# Patient Record
Sex: Male | Born: 1960 | Race: Black or African American | Hispanic: No | Smoking: Current every day smoker
Health system: Southern US, Community
[De-identification: ages and names within clinical notes are randomized; demographics above are authoritative.]

## PROBLEM LIST (undated history)

## (undated) DIAGNOSIS — I2510 Atherosclerotic heart disease of native coronary artery without angina pectoris: Secondary | ICD-10-CM

## (undated) DIAGNOSIS — I10 Essential (primary) hypertension: Secondary | ICD-10-CM

---

## 2020-09-05 DIAGNOSIS — I214 Non-ST elevation (NSTEMI) myocardial infarction: Secondary | ICD-10-CM | POA: Insufficient documentation

## 2021-03-30 ENCOUNTER — Observation Stay (HOSPITAL_BASED_OUTPATIENT_CLINIC_OR_DEPARTMENT_OTHER)
Admission: EM | Admit: 2021-03-30 | Discharge: 2021-03-31 | Disposition: A | Discharge status: Home / Self Care | Payer: Medicaid Other | Attending: Internal Medicine | Admitting: Internal Medicine

## 2021-03-30 ENCOUNTER — Encounter (HOSPITAL_BASED_OUTPATIENT_CLINIC_OR_DEPARTMENT_OTHER): Payer: Self-pay | Admitting: Emergency Medicine

## 2021-03-30 ENCOUNTER — Other Ambulatory Visit: Payer: Self-pay

## 2021-03-30 ENCOUNTER — Emergency Department (HOSPITAL_BASED_OUTPATIENT_CLINIC_OR_DEPARTMENT_OTHER): Payer: Medicaid Other

## 2021-03-30 ENCOUNTER — Observation Stay (HOSPITAL_BASED_OUTPATIENT_CLINIC_OR_DEPARTMENT_OTHER): Payer: Medicaid Other

## 2021-03-30 DIAGNOSIS — Z20822 Contact with and (suspected) exposure to covid-19: Secondary | ICD-10-CM | POA: Insufficient documentation

## 2021-03-30 DIAGNOSIS — I2510 Atherosclerotic heart disease of native coronary artery without angina pectoris: Secondary | ICD-10-CM | POA: Diagnosis not present

## 2021-03-30 DIAGNOSIS — E785 Hyperlipidemia, unspecified: Secondary | ICD-10-CM | POA: Diagnosis not present

## 2021-03-30 DIAGNOSIS — F17200 Nicotine dependence, unspecified, uncomplicated: Secondary | ICD-10-CM

## 2021-03-30 DIAGNOSIS — R0602 Shortness of breath: Secondary | ICD-10-CM | POA: Diagnosis present

## 2021-03-30 DIAGNOSIS — F17210 Nicotine dependence, cigarettes, uncomplicated: Secondary | ICD-10-CM | POA: Diagnosis not present

## 2021-03-30 DIAGNOSIS — Z955 Presence of coronary angioplasty implant and graft: Secondary | ICD-10-CM | POA: Diagnosis not present

## 2021-03-30 DIAGNOSIS — I10 Essential (primary) hypertension: Secondary | ICD-10-CM | POA: Diagnosis not present

## 2021-03-30 DIAGNOSIS — I4891 Unspecified atrial fibrillation: Secondary | ICD-10-CM

## 2021-03-30 DIAGNOSIS — Z7982 Long term (current) use of aspirin: Secondary | ICD-10-CM | POA: Diagnosis not present

## 2021-03-30 DIAGNOSIS — Z79899 Other long term (current) drug therapy: Secondary | ICD-10-CM | POA: Diagnosis not present

## 2021-03-30 HISTORY — DX: Essential (primary) hypertension: I10

## 2021-03-30 HISTORY — DX: Atherosclerotic heart disease of native coronary artery without angina pectoris: I25.10

## 2021-03-30 LAB — ECHOCARDIOGRAM COMPLETE
Area-P 1/2: 3.39 cm2
Height: 69 in
S' Lateral: 3.1 cm
Weight: 2927.71 oz

## 2021-03-30 LAB — RESP PANEL BY RT-PCR (FLU A&B, COVID) ARPGX2
Influenza A by PCR: NEGATIVE
Influenza B by PCR: NEGATIVE
SARS Coronavirus 2 by RT PCR: NEGATIVE

## 2021-03-30 LAB — MRSA NEXT GEN BY PCR, NASAL: MRSA by PCR Next Gen: NOT DETECTED

## 2021-03-30 LAB — CBC
HCT: 43.2 % (ref 39.0–52.0)
Hemoglobin: 14.2 g/dL (ref 13.0–17.0)
MCH: 30.3 pg (ref 26.0–34.0)
MCHC: 32.9 g/dL (ref 30.0–36.0)
MCV: 92.3 fL (ref 80.0–100.0)
Platelets: 391 10*3/uL (ref 150–400)
RBC: 4.68 MIL/uL (ref 4.22–5.81)
RDW: 13.7 % (ref 11.5–15.5)
WBC: 5.9 10*3/uL (ref 4.0–10.5)
nRBC: 0 % (ref 0.0–0.2)

## 2021-03-30 LAB — TSH: TSH: 1.468 u[IU]/mL (ref 0.350–4.500)

## 2021-03-30 LAB — BASIC METABOLIC PANEL
Anion gap: 9 (ref 5–15)
BUN: 13 mg/dL (ref 6–20)
CO2: 22 mmol/L (ref 22–32)
Calcium: 9.6 mg/dL (ref 8.9–10.3)
Chloride: 107 mmol/L (ref 98–111)
Creatinine, Ser: 0.99 mg/dL (ref 0.61–1.24)
GFR, Estimated: >60 mL/min (ref >60)
Glucose, Bld: 117 mg/dL — ABNORMAL HIGH (ref 70–99)
Potassium: 4.1 mmol/L (ref 3.5–5.1)
Sodium: 138 mmol/L (ref 135–145)

## 2021-03-30 LAB — HIV ANTIBODY (ROUTINE TESTING W REFLEX): HIV Screen 4th Generation wRfx: NONREACTIVE

## 2021-03-30 LAB — D-DIMER, QUANTITATIVE: D-Dimer, Quant: <0.27 ug/mL-FEU (ref 0.00–0.50)

## 2021-03-30 LAB — T4, FREE: Free T4: 0.84 ng/dL (ref 0.61–1.12)

## 2021-03-30 LAB — TROPONIN I (HIGH SENSITIVITY)
Troponin I (High Sensitivity): 22 ng/L — ABNORMAL HIGH (ref <18)
Troponin I (High Sensitivity): 14 ng/L (ref <18)

## 2021-03-30 LAB — HEPARIN LEVEL (UNFRACTIONATED): Heparin Unfractionated: 0.52 IU/mL (ref 0.30–0.70)

## 2021-03-30 MED ORDER — ASPIRIN 325 MG PO TABS
325.0000 mg | ORAL_TABLET | Freq: Every day | ORAL | Status: DC
  Administered 2021-03-30: 325 mg via ORAL
  Filled 2021-03-30: qty 1

## 2021-03-30 MED ORDER — ACETAMINOPHEN 325 MG PO TABS: 650.0000 mg | ORAL_TABLET | ORAL | Status: DC | PRN

## 2021-03-30 MED ORDER — THIAMINE HCL 100 MG PO TABS
100.0000 mg | ORAL_TABLET | Freq: Every day | ORAL | Status: DC
  Administered 2021-03-31: 10:00:00 100 mg via ORAL
  Filled 2021-03-30: qty 1

## 2021-03-30 MED ORDER — LOSARTAN POTASSIUM 25 MG PO TABS
25.0000 mg | ORAL_TABLET | Freq: Every day | ORAL | Status: DC
  Administered 2021-03-30 – 2021-03-31 (×2): 25 mg via ORAL
  Filled 2021-03-30 (×2): qty 1

## 2021-03-30 MED ORDER — FOLIC ACID 1 MG PO TABS
1.0000 mg | ORAL_TABLET | Freq: Every day | ORAL | Status: DC
  Administered 2021-03-31: 10:00:00 1 mg via ORAL
  Filled 2021-03-30: qty 1

## 2021-03-30 MED ORDER — SODIUM CHLORIDE 0.9% FLUSH: 3.0000 mL | INTRAVENOUS | Status: DC | PRN

## 2021-03-30 MED ORDER — THIAMINE HCL 100 MG/ML IJ SOLN: 100.0000 mg | Freq: Every day | INTRAMUSCULAR | Status: DC

## 2021-03-30 MED ORDER — ADULT MULTIVITAMIN W/MINERALS CH
1.0000 | ORAL_TABLET | Freq: Every day | ORAL | Status: DC
  Administered 2021-03-31: 10:00:00 1 via ORAL
  Filled 2021-03-30: qty 1

## 2021-03-30 MED ORDER — AMIODARONE HCL 200 MG PO TABS
200.0000 mg | ORAL_TABLET | Freq: Every day | ORAL | Status: DC
  Administered 2021-03-30 – 2021-03-31 (×2): 200 mg via ORAL
  Filled 2021-03-30 (×2): qty 1

## 2021-03-30 MED ORDER — ATORVASTATIN CALCIUM 80 MG PO TABS
80.0000 mg | ORAL_TABLET | Freq: Every day | ORAL | Status: DC
  Administered 2021-03-30: 80 mg via ORAL
  Filled 2021-03-30: qty 1

## 2021-03-30 MED ORDER — ASPIRIN EC 81 MG PO TBEC
81.0000 mg | DELAYED_RELEASE_TABLET | Freq: Every day | ORAL | Status: DC
  Administered 2021-03-31: 10:00:00 81 mg via ORAL
  Filled 2021-03-30: qty 1

## 2021-03-30 MED ORDER — HEPARIN (PORCINE) 25000 UT/250ML-% IV SOLN
1200.0000 [IU]/h | INTRAVENOUS | Status: DC
Start: 2021-03-30 — End: 2021-03-31
  Administered 2021-03-30 – 2021-03-31 (×2): 1200 [IU]/h via INTRAVENOUS
  Filled 2021-03-30 (×2): qty 250

## 2021-03-30 MED ORDER — SODIUM CHLORIDE 0.9% FLUSH
3.0000 mL | Freq: Two times a day (BID) | INTRAVENOUS | Status: DC
  Administered 2021-03-30: 3 mL via INTRAVENOUS

## 2021-03-30 MED ORDER — NICOTINE 21 MG/24HR TD PT24
21.0000 mg | MEDICATED_PATCH | Freq: Every day | TRANSDERMAL | Status: DC
  Administered 2021-03-30 – 2021-03-31 (×2): 21 mg via TRANSDERMAL
  Filled 2021-03-30 (×2): qty 1

## 2021-03-30 MED ORDER — LORAZEPAM 1 MG PO TABS
1.0000 mg | ORAL_TABLET | ORAL | Status: DC | PRN
  Administered 2021-03-30: 2 mg via ORAL
  Administered 2021-03-31: 10:00:00 1 mg via ORAL
  Filled 2021-03-30 (×3): qty 1

## 2021-03-30 MED ORDER — DILTIAZEM HCL-DEXTROSE 125-5 MG/125ML-% IV SOLN (PREMIX)
5.0000 mg/h | INTRAVENOUS | Status: DC
  Administered 2021-03-30: 5 mg/h via INTRAVENOUS
  Filled 2021-03-30 (×2): qty 125

## 2021-03-30 MED ORDER — PRASUGREL HCL 10 MG PO TABS
10.0000 mg | ORAL_TABLET | Freq: Every day | ORAL | Status: DC
  Administered 2021-03-31: 10:00:00 10 mg via ORAL
  Filled 2021-03-30: qty 1

## 2021-03-30 MED ORDER — ZOLPIDEM TARTRATE 5 MG PO TABS: 5.0000 mg | ORAL_TABLET | Freq: Every evening | ORAL | Status: DC | PRN

## 2021-03-30 MED ORDER — AMIODARONE HCL 200 MG PO TABS: 200.0000 mg | ORAL_TABLET | Freq: Every day | ORAL | Status: DC

## 2021-03-30 MED ORDER — DILTIAZEM HCL 25 MG/5ML IV SOLN
20.0000 mg | Freq: Once | INTRAVENOUS | Status: AC
  Administered 2021-03-30: 20 mg via INTRAVENOUS
  Filled 2021-03-30: qty 5

## 2021-03-30 MED ORDER — METOPROLOL SUCCINATE ER 100 MG PO TB24
100.0000 mg | ORAL_TABLET | Freq: Every day | ORAL | Status: DC
  Administered 2021-03-30 – 2021-03-31 (×2): 100 mg via ORAL
  Filled 2021-03-30 (×2): qty 1

## 2021-03-30 MED ORDER — HEPARIN BOLUS VIA INFUSION
4000.0000 [IU] | Freq: Once | INTRAVENOUS | Status: AC
  Administered 2021-03-30: 4000 [IU] via INTRAVENOUS

## 2021-03-30 MED ORDER — HYDRALAZINE HCL 20 MG/ML IJ SOLN: 10.0000 mg | Freq: Four times a day (QID) | INTRAMUSCULAR | Status: DC | PRN

## 2021-03-30 MED ORDER — SODIUM CHLORIDE 0.9 % IV SOLN: 250.0000 mL | INTRAVENOUS | Status: DC | PRN

## 2021-03-30 MED ORDER — MELATONIN 3 MG PO TABS
3.0000 mg | ORAL_TABLET | Freq: Every day | ORAL | Status: DC
Start: 2021-03-30 — End: 2021-03-31
  Administered 2021-03-30: 3 mg via ORAL
  Filled 2021-03-30: qty 1

## 2021-03-30 MED ORDER — ONDANSETRON HCL 4 MG/2ML IJ SOLN: 4.0000 mg | Freq: Four times a day (QID) | INTRAMUSCULAR | Status: DC | PRN

## 2021-03-30 MED ORDER — LORAZEPAM 2 MG/ML IJ SOLN: 1.0000 mg | INTRAMUSCULAR | Status: DC | PRN

## 2021-03-30 NOTE — ED Notes (Signed)
Pt given blanket, and belongings placed in bag per family , pt signed transfer/ transport request

## 2021-03-30 NOTE — Progress Notes (Signed)
  Echocardiogram 2D Echocardiogram has been performed.  Roosvelt Maser F 03/30/2021, 5:42 PM

## 2021-03-30 NOTE — ED Triage Notes (Signed)
Cough x 3 days , shortness of breath last night . Denies chest pain .

## 2021-03-30 NOTE — Progress Notes (Signed)
ANTICOAGULATION CONSULT NOTE   Pharmacy Consult for Heparin Indication: atrial fibrillation  No Known Allergies  Patient Measurements: Height: 5\' 9"  (175.3 cm) Weight: 83 kg (182 lb 15.7 oz) IBW/kg (Calculated) : 70.7 Heparin Dosing Weight: 81.6 kg   Vital Signs: Temp: 98.8 F (37.1 C) (11/16 1652) Temp Source: Oral (11/16 1652) BP: 148/113 (11/16 1652) Pulse Rate: 84 (11/16 1652)  Labs: Recent Labs    03/30/21 1022 03/30/21 1450 03/30/21 1744  HGB 14.2  --   --   HCT 43.2  --   --   PLT 391  --   --   HEPARINUNFRC  --   --  0.52  CREATININE 0.99  --   --   TROPONINIHS 22* 14  --     Estimated Creatinine Clearance: 80.3 mL/min (by C-G formula based on SCr of 0.99 mg/dL).   Medical History: Past Medical History:  Diagnosis Date   Coronary artery disease    Hypertension      Assessment: 60 yo male presented on 03/30/2021 with SOB and cough. Pharmacy consulted to dose heparin for Afib. Patient is not on anticoagulation prior to admission.  Heparin level therapeutic this PM   Goal of Therapy:  Heparin level 0.3-0.7 units/ml Monitor platelets by anticoagulation protocol: Yes   Plan:  Continue heparin at 1200 units / hr Follow daily heparin level, CBC  Planning transition to Eliquis  Thank you 04/01/2021, PharmD 03/30/2021 6:52 PM

## 2021-03-30 NOTE — Progress Notes (Signed)
ANTICOAGULATION CONSULT NOTE - Initial Consult  Pharmacy Consult for Heparin Indication: atrial fibrillation  No Known Allergies  Patient Measurements: Height: 5\' 9"  (175.3 cm) Weight: 81.6 kg (180 lb) IBW/kg (Calculated) : 70.7 Heparin Dosing Weight: 81.6 kg   Vital Signs: Temp: 97.9 F (36.6 C) (11/16 1010) Temp Source: Oral (11/16 1010) BP: 150/107 (11/16 1010) Pulse Rate: 28 (11/16 1010)  Labs: No results for input(s): HGB, HCT, PLT, APTT, LABPROT, INR, HEPARINUNFRC, HEPRLOWMOCWT, CREATININE, CKTOTAL, CKMB, TROPONINIHS in the last 72 hours.  CrCl cannot be calculated (No successful lab value found.).   Medical History: Past Medical History:  Diagnosis Date   Coronary artery disease    Hypertension      Assessment: 60 yo male presented on 03/30/2021 with SOB and cough. Pharmacy consulted to dose heparin for Afib. Patient is not on anticoagulation prior to admission. CBC IP.   Goal of Therapy:  Heparin level 0.3-0.7 units/ml Monitor platelets by anticoagulation protocol: Yes   Plan:  Heparin 4000 unit bolus  Start heparin 1200 units/hr Check 6 hr heparin level   Monitor heparin level, CBC and s/s of bleeding daily  Follow up transition to oral anticoagulation  04/01/2021, PharmD, BCPS Clinical Pharmacist 03/30/2021 10:33 AM

## 2021-03-30 NOTE — Plan of Care (Signed)
  Problem: Elimination: Goal: Will not experience complications related to urinary retention Outcome: Progressing   Problem: Safety: Goal: Ability to remain free from injury will improve Outcome: Progressing   Problem: Education: Goal: Understanding of medication regimen will improve Outcome: Not Progressing   Problem: Health Behavior/Discharge Planning: Goal: Ability to safely manage health-related needs after discharge will improve Outcome: Not Progressing

## 2021-03-30 NOTE — Progress Notes (Signed)
HOSPITAL MEDICINE OVERNIGHT EVENT NOTE    Nursing reports the patient has been exhibiting increasing agitation throughout the evening.  Nursing states that patient reports drinking at least 6 alcoholic drinks daily.  Initiating CIWA protocol with as needed Ativan.  Monitoring for evidence of withdrawal and monitoring for response to as needed benzodiazepines.  Marinda Elk  MD [facility]

## 2021-03-30 NOTE — H&P (Addendum)
[facility] History and Physical  Zachary Savage [id number] DOB: 28-Apr-1961 DOA: 03/30/2021  Referring physician: ED  PCP: Pcp, No   Patient is coming from: Home  Chief Complaint: Shortness of breath  HPI: Zachary Savage is a 60 y.o. male with past medical history of coronary artery disease, status post stent, hypertension, active smoking, hyperlipidemia currently on aspirin prasugrel presented to the hospital with shortness of breath for 2 to 3 days prior to presentation with mild cough.  Patient denied any chest pain fever or chills but had some chest tightness.Marland Kitchen  He complained of nonproductive cough.  He did have some stuffy nose as well.  Patient stated that he did have heart attack 7 to 8 months back and was seen in [place] where he had stent placement.  After that he was on aspirin and Effient statins and beta-blockers but has missed a few doses of statin and beta-blockers.  Patient continues to smoke despite the fact that he is trying to quit.  Patient denies any dizziness, lightheadedness, syncope.  Patient denies any fever, chills or rigor.  Denies any nausea, vomiting or abdominal pain.  Patient denies any urinary urgency, frequency or dysuria.  Denies any history of atrial fibrillation or thyroid disease in the past.  ED Course: In the ED, patient was noted to be in atrial fibrillation with RVR and was started on heparin drip and IV Cardizem.  Patient was then considered for admission to the hospital.  Review of Systems:  All systems were reviewed and were negative unless otherwise mentioned in the HPI  Past Medical History:  Diagnosis Date   Coronary artery disease    Hypertension   Hyperlipidemia  History reviewed. No pertinent surgical history.  Social History:  reports that he has been smoking cigarettes. He has a 10.50 pack-year smoking history. He has never used smokeless tobacco. He reports current alcohol use of about 6.0 standard drinks per week. He reports  current drug use. Frequency: 1.00 time per week. Drugs: Marijuana and Codeine.  No Known Allergies  Family History  Problem Relation Age of Onset   Stroke Mother    Heart attack Mother      Prior to Admission medications   Medication Sig Start Date End Date Taking? Authorizing Provider  aspirin 81 MG EC tablet Take 1 tablet by mouth daily. 09/07/20  Yes [provider]  atorvastatin (LIPITOR) 80 MG tablet Take 80 mg by mouth daily. 11/22/20   [provider]  losartan (COZAAR) 25 MG tablet Take 25 mg by mouth daily. 11/22/20   [provider]  metoprolol succinate (TOPROL-XL) 100 MG 24 hr tablet Take 100 mg by mouth daily. 11/22/20   [provider]  prasugrel (EFFIENT) 10 MG TABS tablet Take 10 mg by mouth daily. 02/21/21   [provider]    Physical Exam: Vitals:   03/30/21 1300 03/30/21 1400 03/30/21 1445 03/30/21 1652  BP: (!) 149/113 (!) 150/101 (!) 141/106 (!) 148/113  Pulse: 89 85 91 84  Resp: (!) 22 (!) 21 19 (!) 25  Temp:    98.8 F (37.1 C)  TempSrc:    Oral  SpO2: 100% 98% 98% 97%  Weight:    83 kg  Height:    5\' 9"  (1.753 m)   Wt Readings from Last 3 Encounters:  03/30/21 83 kg   Body mass index is 27.02 kg/m.  General:  Average built, not in obvious distress HENT: Normocephalic, pupils equally reacting to light and accommodation.  No  scleral pallor or icterus noted. Oral mucosa is moist.  Chest:  Clear breath sounds.  Diminished breath sounds bilaterally. No crackles or wheezes.  CVS: S1 &S2 heard. No murmur.  Irregular rhythm. Abdomen: Soft, nontender, nondistended.  Bowel sounds are heard.  Liver is not palpable, no abdominal mass palpated Extremities: No cyanosis, clubbing or edema.  Peripheral pulses are palpable. Psych: Alert, awake and oriented, normal mood CNS:  No cranial nerve deficits.  Power equal in all extremities.    Skin: Warm and dry.  No rashes noted.  Labs on Admission:   CBC: Recent Labs  Lab  03/30/21 1022  WBC 5.9  HGB 14.2  HCT 43.2  MCV 92.3  PLT 391    Basic Metabolic Panel: Recent Labs  Lab 03/30/21 1022  NA 138  K 4.1  CL 107  CO2 22  GLUCOSE 117*  BUN 13  CREATININE 0.99  CALCIUM 9.6    Liver Function Tests: No results for input(s): AST, ALT, ALKPHOS, BILITOT, PROT, ALBUMIN in the last 168 hours. No results for input(s): LIPASE, AMYLASE in the last 168 hours. No results for input(s): AMMONIA in the last 168 hours.  Cardiac Enzymes: No results for input(s): CKTOTAL, CKMB, CKMBINDEX, TROPONINI in the last 168 hours.  BNP (last 3 results) No results for input(s): BNP in the last 8760 hours.  ProBNP (last 3 results) No results for input(s): PROBNP in the last 8760 hours.  CBG: No results for input(s): GLUCAP in the last 168 hours.  Lipase  No results found for: LIPASE   Urinalysis No results found for: COLORURINE, APPEARANCEUR, LABSPEC, PHURINE, GLUCOSEU, HGBUR, BILIRUBINUR, KETONESUR, PROTEINUR, UROBILINOGEN, NITRITE, LEUKOCYTESUR   Drugs of Abuse  No results found for: LABOPIA, COCAINSCRNUR, LABBENZ, AMPHETMU, THCU, LABBARB    Radiological Exams on Admission: DG Chest Port 1 View  Result Date: 03/30/2021 CLINICAL DATA:  Cough and shortness of breath. EXAM: PORTABLE CHEST 1 VIEW COMPARISON:  None. FINDINGS: The heart size and mediastinal contours are within normal limits. Both lungs are clear. The visualized skeletal structures are unremarkable. IMPRESSION: No active disease. Electronically Signed   By: Obie Dredge M.D.   On: 03/30/2021 10:43    EKG: Personally reviewed by me which shows atrial fibrillation with RVR.  Assessment/Plan Principal Problem:   Atrial fibrillation with RVR (HCC) Active Problems:   CAD (coronary artery disease)  Symptomatic new onset atrial fibrillation with RVR on presentation On Cardizem drip and heparin drip.  Heart rate has improved at this time.  On metoprolol as outpatient.  Has not been taking for  last 2 to 3 days.  Will resume.  Continue to monitor overnight.  TSH and free T4 within normal limits.  He is already on aspirin and Effient as outpatient.  Might need anticoagulation due to CHA2DS2-VASc score of 2.  Will consult cardiology in the situation that he is already on dual antiplatelets with new onset atrial fibrillation..  D-dimer was negative.  COVID was negative.  Initial troponin was mildly elevated followed by normal range.  Spoke with Dr. Sharyn Lull cardiology who recommended amiodarone 200 mg p.o. daily and wean off Cardizem as tolerated.  He suggested maybe we could stop Effient and transition to aspirin and Eliquis on discharge.  Chronic artery disease status post stent placement in the outside hospital in [place].  Patient is currently on aspirin and Effient and statin as outpatient.  We will continue with that.  Check lipid panel in AM.  Hyperlipidemia.  On statins.  We will  continue.  Smoking.  Patient was extensively counseled about it.  We will put the patient on nicotine patch while in the hospital.  DVT Prophylaxis: Heparin drip  Consultant: Cardiology.  Code Status: Full code  Microbiology none  Antibiotics: None  Family Communication:  Patients' condition and plan of care including tests being ordered have been discussed with the patient  who indicate understanding and agree with the plan.   Status is: Observation  The patient remains OBS appropriate and will d/c before 2 midnights.   Severity of Illness: The appropriate patient status for this patient is OBSERVATION. Observation status is judged to be reasonable and necessary in order to provide the required intensity of service to ensure the patient's safety. The patient's presenting symptoms, physical exam findings, and initial radiographic and laboratory data in the context of their medical condition is felt to place them at decreased risk for further clinical deterioration. Furthermore, it is anticipated  that the patient will be medically stable for discharge from the hospital within 2 midnights of admission.   Signed, Flora Lipps, MD [facility] 03/30/2021

## 2021-03-30 NOTE — ED Notes (Signed)
Report to floor [id number] nurse , pt out the door with carelink

## 2021-03-30 NOTE — ED Provider Notes (Signed)
[facility] EMERGENCY DEPARTMENT Provider Note   CSN: [id number] Arrival date & time: 03/30/21  5784     History Chief Complaint  Patient presents with   Shortness of Breath    cough    Zachary Savage is a 60 y.o. male.   Shortness of Breath  60 year old male with a history of coronary artery disease status post stent placement on aspirin and prasugrel presenting to the emergency department with roughly 2 to 3 days of shortness of breath. Patient states that he has had a cough for the past 3 days and had worsening shortness of breath over that time.  He denies any chest pain.  He denies any fever or chills.  His cough is nonproductive.  He denies any history of atrial fibrillation.  He denies any lower extremity swelling.  On arrival, the patient was found to be in atrial fibrillation with RVR with rates in the 150s.  Level 5 caveat due to patient acuity.  Past Medical History:  Diagnosis Date   Coronary artery disease    Hypertension     Patient Active Problem List   Diagnosis Date Noted   Atrial fibrillation with RVR (HCC) 03/30/2021     History reviewed. No pertinent surgical history.     History reviewed. No pertinent family history.     Home Medications Prior to Admission medications   Medication Sig Start Date End Date Taking? Authorizing Provider  aspirin 81 MG EC tablet Take 1 tablet by mouth daily. 09/07/20  Yes [provider]  atorvastatin (LIPITOR) 80 MG tablet Take 80 mg by mouth daily. 11/22/20   [provider]  losartan (COZAAR) 25 MG tablet Take 25 mg by mouth daily. 11/22/20   [provider]  metoprolol succinate (TOPROL-XL) 100 MG 24 hr tablet Take 100 mg by mouth daily. 11/22/20   [provider]  prasugrel (EFFIENT) 10 MG TABS tablet Take 10 mg by mouth daily. 02/21/21   [provider]    Allergies    Patient has no known allergies.  Review of Systems   Review of Systems  Unable to  perform ROS: Acuity of condition  Respiratory:  Positive for shortness of breath.    Physical Exam Updated Vital Signs BP (!) 147/101   Pulse 84   Temp 97.9 F (36.6 C) (Oral)   Resp (!) 26   Ht 5\' 9"  (1.753 m)   Wt 81.6 kg   SpO2 96%   BMI 26.58 kg/m   Physical Exam Vitals and nursing note reviewed.  Constitutional:      General: He is not in acute distress.    Appearance: He is well-developed.  HENT:     Head: Normocephalic and atraumatic.  Eyes:     Conjunctiva/sclera: Conjunctivae normal.     Pupils: Pupils are equal, round, and reactive to light.  Neck:     Vascular: No JVD.  Cardiovascular:     Rate and Rhythm: Tachycardia present. Rhythm irregular.     Pulses: Normal pulses.     Heart sounds: No murmur heard. Pulmonary:     Effort: Pulmonary effort is normal. No respiratory distress.     Breath sounds: Normal breath sounds.  Abdominal:     General: There is no distension.     Palpations: Abdomen is soft.     Tenderness: There is no abdominal tenderness. There is no guarding.  Musculoskeletal:        General: No deformity or signs of injury.  Cervical back: Normal range of motion and neck supple.     Right lower leg: No tenderness. No edema.     Left lower leg: No tenderness. No edema.  Skin:    General: Skin is warm and dry.     Findings: No lesion or rash.  Neurological:     General: No focal deficit present.     Mental Status: He is alert. Mental status is at baseline.    ED Results / Procedures / Treatments   Labs (all labs ordered are listed, but only abnormal results are displayed) Labs Reviewed  BASIC METABOLIC PANEL - Abnormal; Notable for the following components:      Result Value   Glucose, Bld 117 (*)    All other components within normal limits  TROPONIN I (HIGH SENSITIVITY) - Abnormal; Notable for the following components:   Troponin I (High Sensitivity) 22 (*)    All other components within normal limits  RESP PANEL BY RT-PCR (FLU  A&B, COVID) [id number]  CBC  D-DIMER, QUANTITATIVE  HEPARIN LEVEL (UNFRACTIONATED)  TSH  T4, FREE  TROPONIN I (HIGH SENSITIVITY)    EKG EKG Interpretation  Date/Time:  Wednesday March 30 2021 10:10:38 EST Ventricular Rate:  161 PR Interval:    QRS Duration: 91 QT Interval:  285 QTC Calculation: 467 R Axis:   -47 Text Interpretation: Atrial fibrillation Abnormal R-wave progression, late transition LVH with secondary repolarization abnormality Inferior infarct, old Confirmed by Ernie Avena ([id number]) on 03/30/2021 10:26:09 AM  Radiology DG Chest Port 1 View  Result Date: 03/30/2021 CLINICAL DATA:  Cough and shortness of breath. EXAM: PORTABLE CHEST 1 VIEW COMPARISON:  None. FINDINGS: The heart size and mediastinal contours are within normal limits. Both lungs are clear. The visualized skeletal structures are unremarkable. IMPRESSION: No active disease. Electronically Signed   By: Obie Dredge M.D.   On: 03/30/2021 10:43    Procedures .Critical Care E&M Performed by: Ernie Avena, MD  Critical care provider statement:    Critical care time (minutes):  62   Critical care was necessary to treat or prevent imminent or life-threatening deterioration of the following conditions:  Circulatory failure   Critical care was time spent personally by me on the following activities:  Development of treatment plan with patient or surrogate, evaluation of patient's response to treatment, examination of patient, obtaining history from patient or surrogate, ordering and performing treatments and interventions, ordering and review of laboratory studies, ordering and review of radiographic studies, pulse oximetry and re-evaluation of patient's condition   Care discussed with: admitting provider   After initial E/M assessment, critical care services were subsequently performed that were exclusive of separately billable procedures or treatment.     Medications Ordered in ED Medications  diltiazem  (CARDIZEM) 125 mg in dextrose 5% 125 mL (1 mg/mL) infusion (12.5 mg/hr Intravenous Rate/Dose Change 03/30/21 1200)  heparin ADULT infusion 100 units/mL (25000 units/2109mL) (1,200 Units/hr Intravenous New Bag/Given 03/30/21 1052)  diltiazem (CARDIZEM) injection 20 mg (20 mg Intravenous Given 03/30/21 1025)  heparin bolus via infusion 4,000 Units (4,000 Units Intravenous Bolus from Bag 03/30/21 1053)    ED Course  I have reviewed the triage vital signs and the nursing notes.  Pertinent labs & imaging results that were available during my care of the patient were reviewed by me and considered in my medical decision making (see chart for details).    MDM Rules/Calculators/A&P  60 year old male with a history of coronary artery disease status post stent placement on aspirin and prasugrel presenting to the emergency department with roughly 2 to 3 days of shortness of breath. Patient states that he has had a cough for the past 3 days and had worsening shortness of breath over that time.  He denies any chest pain.  He denies any fever or chills.  His cough is nonproductive.  He denies any history of atrial fibrillation.  He denies any lower extremity swelling.  On arrival, the patient was found to be in atrial fibrillation with RVR with rates in the 150s.   EKG confirmed atrial fibrillation with RVR with a ventricular rate of 161.  The patient was administered a 20 mg IV Cardizem bolus with subsequent improvement in rate control to the low 100s.  He was started on a heparin gtt. and diltiazem gtt.  Chest x-ray revealed no acute cardiac or pulmonary abnormality.  Initial troponin was mildly elevated to 24.  A D-dimer was negative.  Low suspicion for acute PE.  The patient complains of no chest pain.  Low suspicion for ACS.  COVID-19 and influenza PCR testing was collected and negative.  CBC revealed no leukocytosis, anemia or platelet abnormality.  The patient most recently had an  ischemic cardiac catheterization in outside hospital in the [facility] area with reported stent placement due to CAD. Given the patient's new onset atrial fibrillation with RVR, plan to admit to medicine for further evaluation.  Will likely need transition to oral rate control medication and oral DOAC.   Final Clinical Impression(s) / ED Diagnoses Final diagnoses:  Atrial fibrillation with RVR [facility])    Rx / DC Orders ED Discharge Orders     None        Ernie Avena, MD 03/30/21 1220

## 2021-03-31 ENCOUNTER — Other Ambulatory Visit (HOSPITAL_COMMUNITY): Payer: Self-pay

## 2021-03-31 DIAGNOSIS — I4891 Unspecified atrial fibrillation: Secondary | ICD-10-CM | POA: Diagnosis not present

## 2021-03-31 DIAGNOSIS — F17200 Nicotine dependence, unspecified, uncomplicated: Secondary | ICD-10-CM | POA: Diagnosis not present

## 2021-03-31 DIAGNOSIS — F1010 Alcohol abuse, uncomplicated: Secondary | ICD-10-CM

## 2021-03-31 DIAGNOSIS — I2510 Atherosclerotic heart disease of native coronary artery without angina pectoris: Secondary | ICD-10-CM | POA: Diagnosis not present

## 2021-03-31 LAB — LIPID PANEL
Cholesterol: 232 mg/dL — ABNORMAL HIGH (ref 0–200)
HDL: 50 mg/dL (ref >40)
LDL Cholesterol: 166 mg/dL — ABNORMAL HIGH (ref 0–99)
Total CHOL/HDL Ratio: 4.6 RATIO
Triglycerides: 81 mg/dL (ref <150)
VLDL: 16 mg/dL (ref 0–40)

## 2021-03-31 LAB — CBC
HCT: 39.4 % (ref 39.0–52.0)
Hemoglobin: 12.6 g/dL — ABNORMAL LOW (ref 13.0–17.0)
MCH: 29.6 pg (ref 26.0–34.0)
MCHC: 32 g/dL (ref 30.0–36.0)
MCV: 92.7 fL (ref 80.0–100.0)
Platelets: 350 10*3/uL (ref 150–400)
RBC: 4.25 MIL/uL (ref 4.22–5.81)
RDW: 13.7 % (ref 11.5–15.5)
WBC: 7 10*3/uL (ref 4.0–10.5)
nRBC: 0 % (ref 0.0–0.2)

## 2021-03-31 LAB — BASIC METABOLIC PANEL
Anion gap: 9 (ref 5–15)
BUN: 10 mg/dL (ref 6–20)
CO2: 22 mmol/L (ref 22–32)
Calcium: 9.3 mg/dL (ref 8.9–10.3)
Chloride: 105 mmol/L (ref 98–111)
Creatinine, Ser: 1.08 mg/dL (ref 0.61–1.24)
GFR, Estimated: >60 mL/min (ref >60)
Glucose, Bld: 101 mg/dL — ABNORMAL HIGH (ref 70–99)
Potassium: 3.7 mmol/L (ref 3.5–5.1)
Sodium: 136 mmol/L (ref 135–145)

## 2021-03-31 LAB — HEPARIN LEVEL (UNFRACTIONATED): Heparin Unfractionated: 0.62 IU/mL (ref 0.30–0.70)

## 2021-03-31 MED ORDER — THIAMINE HCL 100 MG PO TABS: 100.0000 mg | ORAL_TABLET | Freq: Every day | ORAL | Status: AC

## 2021-03-31 MED ORDER — APIXABAN 5 MG PO TABS: 5.0000 mg | ORAL_TABLET | Freq: Two times a day (BID) | ORAL | 3 refills | Status: DC

## 2021-03-31 MED ORDER — NICOTINE 21 MG/24HR TD PT24
21.0000 mg | MEDICATED_PATCH | Freq: Every day | TRANSDERMAL | 0 refills | Status: AC
  Filled 2021-03-31: qty 28, 28d supply, fill #0

## 2021-03-31 MED ORDER — ADULT MULTIVITAMIN W/MINERALS CH: 1.0000 | ORAL_TABLET | Freq: Every day | ORAL | Status: DC

## 2021-03-31 MED ORDER — THIAMINE HCL 100 MG PO TABS: 100.0000 mg | ORAL_TABLET | Freq: Every day | ORAL | Status: DC

## 2021-03-31 MED ORDER — AMIODARONE HCL 200 MG PO TABS
200.0000 mg | ORAL_TABLET | Freq: Every day | ORAL | 2 refills | Status: AC
  Filled 2021-03-31: qty 30, 30d supply, fill #0

## 2021-03-31 MED ORDER — ADULT MULTIVITAMIN W/MINERALS CH: 1.0000 | ORAL_TABLET | Freq: Every day | ORAL | Status: AC

## 2021-03-31 MED ORDER — FOLIC ACID 1 MG PO TABS: 1.0000 mg | ORAL_TABLET | Freq: Every day | ORAL | 0 refills | Status: AC

## 2021-03-31 MED ORDER — NICOTINE 21 MG/24HR TD PT24
21.0000 mg | MEDICATED_PATCH | Freq: Every day | TRANSDERMAL | 0 refills | Status: DC
Start: 2021-04-01 — End: 2021-03-31

## 2021-03-31 MED ORDER — AMIODARONE HCL 200 MG PO TABS
200.0000 mg | ORAL_TABLET | Freq: Every day | ORAL | 2 refills | Status: DC
Start: 2021-04-01 — End: 2021-03-31

## 2021-03-31 MED ORDER — TICAGRELOR 90 MG PO TABS: 90.0000 mg | ORAL_TABLET | Freq: Two times a day (BID) | ORAL | 2 refills | Status: DC

## 2021-03-31 MED ORDER — TICAGRELOR 90 MG PO TABS: 90.0000 mg | ORAL_TABLET | Freq: Two times a day (BID) | ORAL | Status: DC

## 2021-03-31 MED ORDER — APIXABAN 5 MG PO TABS
5.0000 mg | ORAL_TABLET | Freq: Two times a day (BID) | ORAL | Status: DC
  Administered 2021-03-31: 12:00:00 5 mg via ORAL
  Filled 2021-03-31: qty 1

## 2021-03-31 MED ORDER — APIXABAN 5 MG PO TABS
5.0000 mg | ORAL_TABLET | Freq: Two times a day (BID) | ORAL | 3 refills | Status: AC
  Filled 2021-03-31: qty 60, 30d supply, fill #0

## 2021-03-31 MED ORDER — TICAGRELOR 90 MG PO TABS
90.0000 mg | ORAL_TABLET | Freq: Two times a day (BID) | ORAL | 2 refills | Status: AC
  Filled 2021-03-31: qty 60, 30d supply, fill #0

## 2021-03-31 MED ORDER — FOLIC ACID 1 MG PO TABS: 1.0000 mg | ORAL_TABLET | Freq: Every day | ORAL | 0 refills | Status: DC

## 2021-03-31 MED ORDER — COVID-19MRNA BIVAL VACC PFIZER 30 MCG/0.3ML IM SUSP
0.3000 mL | Freq: Once | INTRAMUSCULAR | Status: DC
  Filled 2021-03-31: qty 0.3

## 2021-03-31 NOTE — TOC Benefit Eligibility Note (Signed)
Patient Product/process development scientist completed.    The patient is currently admitted and upon discharge could be taking Eliquis 5 mg.  The current 30 day co-pay is, $4.00.   The patient is insured through [employer]     Roland Earl, CPhT Pharmacy Patient Advocate Specialist [facility] Patient Advocate Team Direct Number: [phone]  Fax: [phone]

## 2021-03-31 NOTE — Discharge Summary (Signed)
Physician Discharge Summary  [name] [id number] DOB: January 08, 1961 DOA: 03/30/2021  PCP: Merryl Hacker, No  Admit date: 03/30/2021 Discharge date: 03/31/2021  Admitted From: Home  Discharge disposition: Home   Recommendations for Outpatient Follow-Up:   Follow up with your primary care provider in one week.  Check CBC, BMP, magnesium in the next visit Follow-up with your cardiologist in [place] in 1 to 2 weeks.   Discharge Diagnosis:   Principal Problem:   Atrial fibrillation with RVR (HCC) Active Problems:   CAD (coronary artery disease) Hypertension Alcohol abuse, chronic smoking  Discharge Condition: Improved.  Diet recommendation: Low sodium, heart healthy.    Wound care: None.  Code status: Full.   History of Present Illness:   Zachary Savage is a 60 y.o. male with past medical history of coronary artery disease, status post stent, hypertension, active smoking, hyperlipidemia currently on aspirin prasugrel presented to the hospital with shortness of breath for 2 to 3 days prior to presentation with mild cough.  Patient denied any chest pain fever or chills but had some chest tightness..   In the ED, patient was noted to be in atrial fibrillation with RVR and was started on heparin drip and IV Cardizem.  Patient was then considered for admission to the hospital for new onset atrial fibrillation.  Hospital Course:   Following conditions were addressed during hospitalization as listed below,  Symptomatic new onset atrial fibrillation with RVR on presentation Patient was initially on Cardizem drip and heparin drip.  Likely spontaneous conversion.  Patient had not been taking his metoprolol for few days prior to the presentation.  TSH was within normal limits.  Patient will be continued on metoprolol and low-dose amiodarone as per cardiology.  He will have to follow-up with his regular cardiologist at [facility].  COVID was negative.  Aspirin and Effient was  discontinued and patient was started on Brilinta and Eliquis on discharge.    Chronic artery disease status post stent placement in the outside hospital in [place].  Patient is currently on aspirin and Effient and statin as outpatient.  Cardiology has seen the patient and has recommended Brilinta and Eliquis on discharge.     Hyperlipidemia.  Continue Lipitor from discharge   Smoking alcohol abuse.  Patient was extensively counseled about it.  Patient was encouraged to quit smoking.  Nicotine patch has been prescribed on discharge.  Disposition.  At this time, patient is stable for disposition and PCP and cardiology follow-up  Medical Consultants:   Cardiology  Procedures:    None Subjective:   Today, patient was seen and examined at bedside.  Denies any dizziness,lightheadedness chest pain shortness of breath.  Patient's family at bedside.  Discharge Exam:   Vitals:   03/31/21 0300 03/31/21 0700  BP: 106/67 (!) 153/111  Pulse: 71 73  Resp: 20 (!) 24  Temp: 97.7 F (36.5 C) 97.8 F (36.6 C)  SpO2: 92% 95%   Vitals:   03/30/21 2015 03/30/21 2300 03/31/21 0300 03/31/21 0700  BP: (!) 154/107 (!) 127/95 106/67 (!) 153/111  Pulse: 95 70 71 73  Resp: (!) 23 18 20  (!) 24  Temp: 98.4 F (36.9 C) 98.2 F (36.8 C) 97.7 F (36.5 C) 97.8 F (36.6 C)  TempSrc: Oral Oral Oral Oral  SpO2: 97% 95% 92% 95%  Weight:      Height:        General: Alert awake, not in obvious distress HENT: pupils equally reacting to light,  No scleral pallor or icterus  noted. Oral mucosa is moist.  Chest:  Clear breath sounds.  Diminished breath sounds bilaterally. No crackles or wheezes.  CVS: S1 &S2 heard. No murmur.  Regular rate and rhythm. Abdomen: Soft, nontender, nondistended.  Bowel sounds are heard.   Extremities: No cyanosis, clubbing or edema.  Peripheral pulses are palpable. Psych: Alert, awake and oriented, normal mood CNS:  No cranial nerve deficits.  Power equal in all  extremities.   Skin: Warm and dry.  No rashes noted.  The results of significant diagnostics from this hospitalization (including imaging, microbiology, ancillary and laboratory) are listed below for reference.     Diagnostic Studies:   DG Chest Port 1 View  Result Date: 03/30/2021 CLINICAL DATA:  Cough and shortness of breath. EXAM: PORTABLE CHEST 1 VIEW COMPARISON:  None. FINDINGS: The heart size and mediastinal contours are within normal limits. Both lungs are clear. The visualized skeletal structures are unremarkable. IMPRESSION: No active disease. Electronically Signed   By: Titus Dubin M.D.   On: 03/30/2021 10:43   ECHOCARDIOGRAM COMPLETE  Result Date: 03/30/2021    ECHOCARDIOGRAM REPORT   Patient Name:   Zachary Savage Date of Exam: 03/30/2021 Medical Rec #:  [id number]     Height:       69.0 in Accession #:    [id number]    Weight:       183.0 lb Date of Birth:  1960/07/06      BSA:          1.989 m Patient Age:    60 years      BP:           125/95 mmHg Patient Gender: M             HR:           77 bpm. Exam Location:  Inpatient Procedure: 2D Echo, Cardiac Doppler and Color Doppler Indications:    Atrial fibrillation  History:        Patient has no prior history of Echocardiogram examinations.                 Previous Myocardial Infarction and CAD, Arrythmias:Atrial                 Fibrillation; Risk Factors:Hypertension and Current Smoker.  Sonographer:    Merrie Roof RDCS Referring Phys: [id number] [place]  1. Left ventricular ejection fraction, by estimation, is 55%. The left ventricle has normal function. Left ventricular endocardial border not optimally defined to evaluate regional wall motion. Left ventricular diastolic parameters are consistent with Grade II diastolic dysfunction (pseudonormalization).  2. Right ventricular systolic function is normal. The right ventricular size is normal. There is normal pulmonary artery systolic pressure. The estimated right  ventricular systolic pressure is [id number] mmHg.  3. The mitral valve is normal in structure. No evidence of mitral valve regurgitation. No evidence of mitral stenosis.  4. The aortic valve is tricuspid. Aortic valve regurgitation is not visualized. Aortic valve sclerosis/calcification is present, without any evidence of aortic stenosis.  5. The inferior vena cava is normal in size with greater than 50% respiratory variability, suggesting right atrial pressure of 3 mmHg. FINDINGS  Left Ventricle: Left ventricular ejection fraction, by estimation, is 55%. The left ventricle has normal function. Left ventricular endocardial border not optimally defined to evaluate regional wall motion. The left ventricular internal cavity size was normal in size. There is no left ventricular hypertrophy. Left ventricular diastolic parameters are consistent with Grade II diastolic dysfunction (  pseudonormalization). Right Ventricle: The right ventricular size is normal. No increase in right ventricular wall thickness. Right ventricular systolic function is normal. There is normal pulmonary artery systolic pressure. The tricuspid regurgitant velocity is 2.62 m/s, and  with an assumed right atrial pressure of 3 mmHg, the estimated right ventricular systolic pressure is [id number] mmHg. Left Atrium: Left atrial size was normal in size. Right Atrium: Right atrial size was normal in size. Pericardium: There is no evidence of pericardial effusion. Mitral Valve: The mitral valve is normal in structure. No evidence of mitral valve regurgitation. No evidence of mitral valve stenosis. Tricuspid Valve: The tricuspid valve is normal in structure. Tricuspid valve regurgitation is trivial. Aortic Valve: The aortic valve is tricuspid. Aortic valve regurgitation is not visualized. Aortic valve sclerosis/calcification is present, without any evidence of aortic stenosis. Pulmonic Valve: The pulmonic valve was normal in structure. Pulmonic valve regurgitation is not  visualized. Aorta: The aortic root is normal in size and structure. Venous: The inferior vena cava is normal in size with greater than 50% respiratory variability, suggesting right atrial pressure of 3 mmHg. IAS/Shunts: No atrial level shunt detected by color flow Doppler.  LEFT VENTRICLE PLAX 2D LVIDd:         4.40 cm   Diastology LVIDs:         3.10 cm   LV e' medial:    6.64 cm/s LV PW:         1.10 cm   LV E/e' medial:  10.2 LV IVS:        1.00 cm   LV e' lateral:   10.80 cm/s LVOT diam:     2.00 cm   LV E/e' lateral: 6.3 LV SV:         57 LV SV Index:   29 LVOT Area:     3.14 cm  RIGHT VENTRICLE RV Basal diam:  2.60 cm LEFT ATRIUM             Index        RIGHT ATRIUM           Index LA diam:        2.70 cm 1.36 cm/m   RA Area:     16.40 cm LA Vol (A2C):   49.4 ml 24.83 ml/m  RA Volume:   41.70 ml  20.96 ml/m LA Vol (A4C):   34.9 ml 17.54 ml/m LA Biplane Vol: 43.6 ml 21.92 ml/m  AORTIC VALVE LVOT Vmax:   99.90 cm/s LVOT Vmean:  65.400 cm/s LVOT VTI:    0.181 m  AORTA Ao Root diam: 3.50 cm Ao Asc diam:  3.40 cm MITRAL VALVE               TRICUSPID VALVE MV Area (PHT): 3.39 cm    TR Peak grad:   27.5 mmHg MV Decel Time: 224 msec    TR Vmax:        262.00 cm/s MV E velocity: 67.70 cm/s MV A velocity: 64.70 cm/s  SHUNTS MV E/A ratio:  1.05        Systemic VTI:  0.18 m                            Systemic Diam: 2.00 cm Dalton McleanMD Electronically signed by Franki Monte Signature Date/Time: 03/30/2021/6:06:34 PM    Final      Labs:   Basic Metabolic Panel: Recent Labs  Lab 03/30/21 1022 03/31/21 0324  NA 138 136  K 4.1 3.7  CL 107 105  CO2 22 22  GLUCOSE 117* 101*  BUN 13 10  CREATININE 0.99 1.08  CALCIUM 9.6 9.3   GFR Estimated Creatinine Clearance: 73.6 mL/min (by C-G formula based on SCr of 1.08 mg/dL). Liver Function Tests: No results for input(s): AST, ALT, ALKPHOS, BILITOT, PROT, ALBUMIN in the last 168 hours. No results for input(s): LIPASE, AMYLASE in the last 168  hours. No results for input(s): AMMONIA in the last 168 hours. Coagulation profile No results for input(s): INR, PROTIME in the last 168 hours.  CBC: Recent Labs  Lab 03/30/21 1022 03/31/21 0324  WBC 5.9 7.0  HGB 14.2 12.6*  HCT 43.2 39.4  MCV 92.3 92.7  PLT 391 350   Cardiac Enzymes: No results for input(s): CKTOTAL, CKMB, CKMBINDEX, TROPONINI in the last 168 hours. BNP: Invalid input(s): POCBNP CBG: No results for input(s): GLUCAP in the last 168 hours. D-Dimer Recent Labs    03/30/21 1022  DDIMER <0.27   Hgb A1c No results for input(s): HGBA1C in the last 72 hours. Lipid Profile Recent Labs    03/31/21 0324  CHOL 232*  HDL 50  LDLCALC 166*  TRIG 81  CHOLHDL 4.6   Thyroid function studies Recent Labs    03/30/21 1022  TSH 1.468   Anemia work up No results for input(s): VITAMINB12, FOLATE, FERRITIN, TIBC, IRON, RETICCTPCT in the last 72 hours. Microbiology Recent Results (from the past 240 hour(s))  Resp Panel by RT-PCR (Flu A&B, Covid) Nasopharyngeal Swab     Status: None   Collection Time: 03/30/21 10:22 AM   Specimen: Nasopharyngeal Swab; Nasopharyngeal(NP) swabs in vial transport medium  Result Value Ref Range Status   SARS Coronavirus 2 by RT PCR NEGATIVE NEGATIVE Final    Comment: (NOTE) SARS-CoV-2 target nucleic acids are NOT DETECTED.  The SARS-CoV-2 RNA is generally detectable in upper respiratory specimens during the acute phase of infection. The lowest concentration of SARS-CoV-2 viral copies this assay can detect is 138 copies/mL. A negative result does not preclude SARS-Cov-2 infection and should not be used as the sole basis for treatment or other patient management decisions. A negative result may occur with  improper specimen collection/handling, submission of specimen other than nasopharyngeal swab, presence of viral mutation(s) within the areas targeted by this assay, and inadequate number of viral copies(<138 copies/mL). A  negative result must be combined with clinical observations, patient history, and epidemiological information. The expected result is Negative.  Fact Sheet for Patients:  EntrepreneurPulse.com.au  Fact Sheet for Healthcare Providers:  IncredibleEmployment.be  This test is no t yet approved or cleared by the [place] FDA and  has been authorized for detection and/or diagnosis of SARS-CoV-2 by FDA under an Emergency Use Authorization (EUA). This EUA will remain  in effect (meaning this test can be used) for the duration of the COVID-19 declaration under Section 564(b)(1) of the Act, 21 U.S.C.section 360bbb-3(b)(1), unless the authorization is terminated  or revoked sooner.       Influenza A by PCR NEGATIVE NEGATIVE Final   Influenza B by PCR NEGATIVE NEGATIVE Final    Comment: (NOTE) The Xpert Xpress SARS-CoV-2/FLU/RSV plus assay is intended as an aid in the diagnosis of influenza from Nasopharyngeal swab specimens and should not be used as a sole basis for treatment. Nasal washings and aspirates are unacceptable for Xpert Xpress SARS-CoV-2/FLU/RSV testing.  Fact Sheet for Patients: EntrepreneurPulse.com.au  Fact Sheet for Healthcare Providers: IncredibleEmployment.be  This test  is not yet approved or cleared by the [place] and has been authorized for detection and/or diagnosis of SARS-CoV-2 by FDA under an Emergency Use Authorization (EUA). This EUA will remain in effect (meaning this test can be used) for the duration of the COVID-19 declaration under Section 564(b)(1) of the Act, 21 U.S.C. section 360bbb-3(b)(1), unless the authorization is terminated or revoked.  Performed at [facility], [address] [place], [place] [address]   MRSA Next Gen by PCR, Nasal     Status: None   Collection Time: 03/30/21  4:53 PM   Specimen: Nasal Mucosa; Nasal Swab  Result Value Ref  Range Status   MRSA by PCR Next Gen NOT DETECTED NOT DETECTED Final    Comment: (NOTE) The GeneXpert MRSA Assay (FDA approved for NASAL specimens only), is one component of a comprehensive MRSA colonization surveillance program. It is not intended to diagnose MRSA infection nor to guide or monitor treatment for MRSA infections. Test performance is not FDA approved in patients less than 51 years old. Performed at [facility], [address] [place], [place] [address]      Discharge Instructions:   Discharge Instructions     Diet - low sodium heart healthy   Complete by: As directed    Discharge instructions   Complete by: As directed    Follow-up with your primary care physician in 1 week.  Follow-up with your cardiologist in 2 to 3 weeks.  Your medications have changed, please take a note.  Please seek medical attention for worsening symptoms.  Please do not drink alcohol or smoke cigarettes.  It is important that you continue to take your medication without interruption.   Increase activity slowly   Complete by: As directed       Allergies as of 03/31/2021   No Known Allergies      Medication List     STOP taking these medications    aspirin 81 MG EC tablet   prasugrel 10 MG Tabs tablet Commonly known as: EFFIENT       TAKE these medications    amiodarone 200 MG tablet Commonly known as: PACERONE Take 1 tablet (200 mg total) by mouth daily. Start taking on: April 01, 2021   atorvastatin 80 MG tablet Commonly known as: LIPITOR Take 80 mg by mouth daily.   Brilinta 90 MG Tabs tablet Generic drug: ticagrelor Take 1 tablet (90 mg total) by mouth 2 (two) times daily.   Eliquis 5 MG Tabs tablet Generic drug: apixaban Take 1 tablet (5 mg total) by mouth 2 (two) times daily.   folic acid 1 MG tablet Commonly known as: FOLVITE Take 1 tablet (1 mg total) by mouth daily. Start taking on: April 01, 2021   losartan 25 MG tablet Commonly known  as: COZAAR Take 25 mg by mouth daily.   metoprolol succinate 100 MG 24 hr tablet Commonly known as: TOPROL-XL Take 100 mg by mouth daily.   multivitamin with minerals Tabs tablet Take 1 tablet by mouth daily. Start taking on: April 01, 2021   nicotine 21 mg/24hr patch Commonly known as: NICODERM CQ - dosed in mg/24 hours Place 1 patch (21 mg total) onto the skin daily. Start taking on: April 01, 2021   thiamine 100 MG tablet Take 1 tablet (100 mg total) by mouth daily. Start taking on: April 01, 2021        Follow-up Information     Primary care provider.  Schedule an appointment as soon as possible for a visit in 1 week(s).          Cardiology Follow up in 2 week(s).                   Time coordinating discharge: 39 minutes  Signed:  Rami Budhu  [facility] 03/31/2021, 3:34 PM

## 2021-03-31 NOTE — TOC Transition Note (Addendum)
Transition of Care [facility]) - CM/SW Discharge Note   Patient Details  Name: Zachary Savage MRN: [id number] Date of Birth: 12/22/1960  [facility]) CM/SW Contact:  Leone Haven, RN Phone Number: 03/31/2021, 12:51 PM   Clinical Narrative:    Patient is for dc today, NCM spoke with patient regarding his PCP.  He states his daughter is getting him a PCP in [place] and he is going back to [employer].  He states he does not need any DME and his nephew will transport him home today. He states he would like the [place] resources. NCM will give these to him. Patient has Medicaid so his copay for Medications is 3.00 to 4.00.     Final next level of care: Home/Self Care Barriers to Discharge: No Barriers Identified   Patient Goals and CMS Choice Patient states their goals for this hospitalization and ongoing recovery are:: return to [facility]   Choice offered to / list presented to : NA  Discharge Placement                       Discharge Plan and Services                  DME Agency: NA                  Social Determinants of Health (SDOH) Interventions     Readmission Risk Interventions No flowsheet data found.

## 2021-03-31 NOTE — Consult Note (Signed)
Reason for Consult: Paroxysmal A. fib with RVR Referring Physician: [facility] hospitalist  Zachary Savage is an 60 y.o. male.  HPI: Patient is 60 year old male with past medical history significant for coronary artery disease history of MI approximately 8 months ago in [place] requiring PCI, hypertension, hyperlipidemia, tobacco abuse, EtOH abuse, was admitted yesterday because of sudden onset of palpitations associated with shortness of breath while drinking alcohol late at night watching TV.  Patient was noted to be in A. fib with RVR subsequently converted back to sinus rhythm after starting IV Cardizem drip.  Patient states he has been drinking 6 cans of beer daily and continues to smoke 8 cigarettes now and daily has been smoking for last 35 years.  Patient denies any chest pain nausea vomiting diaphoresis.  Denies PND orthopnea leg swelling.  Patient was started on heparin and was started on p.o. amiodarone patient has remained in sinus rhythm.  Patient presently denies any complaints.  States plans to go back to [employer] and will follow up with his cardiologist.  Past Medical History:  Diagnosis Date   Coronary artery disease    Hypertension     History reviewed. No pertinent surgical history.  Family History  Problem Relation Age of Onset   Stroke Mother    Heart attack Mother     Social History:  reports that he has been smoking cigarettes. He has a 10.50 pack-year smoking history. He has never used smokeless tobacco. He reports current alcohol use of about 6.0 standard drinks per week. He reports current drug use. Frequency: 1.00 time per week. Drugs: Marijuana and Codeine.  Allergies: No Known Allergies  Medications: I have reviewed the patient's current medications.  Results for orders placed or performed during the hospital encounter of 03/30/21 (from the past 48 hour(s))  Basic metabolic panel     Status: Abnormal   Collection Time: 03/30/21 10:22 AM   Result Value Ref Range   Sodium 138 135 - 145 mmol/L   Potassium 4.1 3.5 - 5.1 mmol/L   Chloride 107 98 - 111 mmol/L   CO2 22 22 - 32 mmol/L   Glucose, Bld 117 (H) 70 - 99 mg/dL    Comment: Glucose reference range applies only to samples taken after fasting for at least 8 hours.   BUN 13 6 - 20 mg/dL   Creatinine, Ser 0.99 0.61 - 1.24 mg/dL   Calcium 9.6 8.9 - 10.3 mg/dL   GFR, Estimated >60 >60 mL/min    Comment: (NOTE) Calculated using the CKD-EPI Creatinine Equation (2021)    Anion gap 9 5 - 15    Comment: Performed at [facility], [address] [place], [place] [address]  CBC     Status: None   Collection Time: 03/30/21 10:22 AM  Result Value Ref Range   WBC 5.9 4.0 - 10.5 K/uL   RBC 4.68 4.22 - 5.81 MIL/uL   Hemoglobin 14.2 13.0 - 17.0 g/dL   HCT 43.2 39.0 - 52.0 %   MCV 92.3 80.0 - 100.0 fL   MCH 30.3 26.0 - 34.0 pg   MCHC 32.9 30.0 - 36.0 g/dL   RDW 13.7 11.5 - 15.5 %   Platelets 391 150 - 400 K/uL   nRBC 0.0 0.0 - 0.2 %    Comment: Performed at [facility], [address] [place], [place] [address]  Troponin I (High Sensitivity)     Status: Abnormal   Collection Time: 03/30/21  10:22 AM  Result Value Ref Range   Troponin I (High Sensitivity) 22 (H) <18 ng/L    Comment: (NOTE) Elevated high sensitivity troponin I (hsTnI) values and significant  changes across serial measurements may suggest ACS but many other  chronic and acute conditions are known to elevate hsTnI results.  Refer to the "Links" section for chest pain algorithms and additional  guidance. Performed at [facility], [address] [place], [place] [address]   D-dimer, quantitative     Status: None   Collection Time: 03/30/21 10:22 AM  Result Value Ref Range   D-Dimer, Quant <0.27 0.00 - 0.50 ug/mL-FEU    Comment: (NOTE) At the manufacturer cut-off value of 0.5 g/mL FEU, this assay has a negative predictive value of 95-100%.This assay is intended for  use in conjunction with a clinical pretest probability (PTP) assessment model to exclude pulmonary embolism (PE) and deep venous thrombosis (DVT) in outpatients suspected of PE or DVT. Results should be correlated with clinical presentation. Performed at [facility], [address] [place], [place] [address]   Resp Panel by RT-PCR (Flu A&B, Covid) Nasopharyngeal Swab     Status: None   Collection Time: 03/30/21 10:22 AM   Specimen: Nasopharyngeal Swab; Nasopharyngeal(NP) swabs in vial transport medium  Result Value Ref Range   SARS Coronavirus 2 by RT PCR NEGATIVE NEGATIVE    Comment: (NOTE) SARS-CoV-2 target nucleic acids are NOT DETECTED.  The SARS-CoV-2 RNA is generally detectable in upper respiratory specimens during the acute phase of infection. The lowest concentration of SARS-CoV-2 viral copies this assay can detect is 138 copies/mL. A negative result does not preclude SARS-Cov-2 infection and should not be used as the sole basis for treatment or other patient management decisions. A negative result may occur with  improper specimen collection/handling, submission of specimen other than nasopharyngeal swab, presence of viral mutation(s) within the areas targeted by this assay, and inadequate number of viral copies(<138 copies/mL). A negative result must be combined with clinical observations, patient history, and epidemiological information. The expected result is Negative.  Fact Sheet for Patients:  BloggerCourse.com  Fact Sheet for Healthcare Providers:  SeriousBroker.it  This test is no t yet approved or cleared by the [place] FDA and  has been authorized for detection and/or diagnosis of SARS-CoV-2 by FDA under an Emergency Use Authorization (EUA). This EUA will remain  in effect (meaning this test can be used) for the duration of the COVID-19 declaration under Section 564(b)(1) of the Act,  21 U.S.C.section 360bbb-3(b)(1), unless the authorization is terminated  or revoked sooner.       Influenza A by PCR NEGATIVE NEGATIVE   Influenza B by PCR NEGATIVE NEGATIVE    Comment: (NOTE) The Xpert Xpress SARS-CoV-2/FLU/RSV plus assay is intended as an aid in the diagnosis of influenza from Nasopharyngeal swab specimens and should not be used as a sole basis for treatment. Nasal washings and aspirates are unacceptable for Xpert Xpress SARS-CoV-2/FLU/RSV testing.  Fact Sheet for Patients: BloggerCourse.com  Fact Sheet for Healthcare Providers: SeriousBroker.it  This test is not yet approved or cleared by the [place] FDA and has been authorized for detection and/or diagnosis of SARS-CoV-2 by FDA under an Emergency Use Authorization (EUA). This EUA will remain in effect (meaning this test can be used) for the duration of the COVID-19 declaration under Section 564(b)(1) of the Act, 21 U.S.C. section 360bbb-3(b)(1), unless the authorization is terminated or revoked.  Performed at [facility], [address] [place], [place] [address]   TSH     Status: None   Collection Time: 03/30/21 10:22 AM  Result Value Ref Range   TSH 1.468 0.350 - 4.500 uIU/mL    Comment: Performed by a 3rd Generation assay with a functional sensitivity of <=0.01 uIU/mL. Performed at [facility], [place] [address] [place], [place] [address]   T4, free     Status: None   Collection Time: 03/30/21 10:22 AM  Result Value Ref Range   Free T4 0.84 0.61 - 1.12 ng/dL    Comment: (NOTE) Biotin ingestion may interfere with free T4 tests. If the results are inconsistent with the TSH level, previous test results, or the clinical presentation, then consider biotin interference. If needed, order repeat testing after stopping biotin. Performed at [facility], [place] [address], [place], [place] [address]   Troponin I (High  Sensitivity)     Status: None   Collection Time: 03/30/21  2:50 PM  Result Value Ref Range   Troponin I (High Sensitivity) 14 <18 ng/L    Comment: (NOTE) Elevated high sensitivity troponin I (hsTnI) values and significant  changes across serial measurements may suggest ACS but many other  chronic and acute conditions are known to elevate hsTnI results.  Refer to the "Links" section for chest pain algorithms and additional  guidance. Performed at [facility], [address] [place], [place] [address]   MRSA Next Gen by PCR, Nasal     Status: None   Collection Time: 03/30/21  4:53 PM   Specimen: Nasal Mucosa; Nasal Swab  Result Value Ref Range   MRSA by PCR Next Gen NOT DETECTED NOT DETECTED    Comment: (NOTE) The GeneXpert MRSA Assay (FDA approved for NASAL specimens only), is one component of a comprehensive MRSA colonization surveillance program. It is not intended to diagnose MRSA infection nor to guide or monitor treatment for MRSA infections. Test performance is not FDA approved in patients less than 62 years old. Performed at [facility], [place] [address] [place], [place] [address]   Heparin level (unfractionated)     Status: None   Collection Time: 03/30/21  5:44 PM  Result Value Ref Range   Heparin Unfractionated 0.52 0.30 - 0.70 IU/mL    Comment: (NOTE) The clinical reportable range upper limit is being lowered to >1.10 to align with the FDA approved guidance for the current laboratory assay.  If heparin results are below expected values, and patient dosage has  been confirmed, suggest follow up testing of antithrombin III levels. Performed at [facility], [place] [address], [place], [place] [address]   HIV Antibody (routine testing w rflx)     Status: None   Collection Time: 03/30/21  5:44 PM  Result Value Ref Range   HIV Screen 4th Generation wRfx Non Reactive Non Reactive    Comment: Performed at [facility], [place] [address],  [place], [place] [address]  Heparin level (unfractionated)     Status: None   Collection Time: 03/31/21  3:24 AM  Result Value Ref Range   Heparin Unfractionated 0.62 0.30 - 0.70 IU/mL    Comment: (NOTE) The clinical reportable range upper limit is being lowered to >1.10 to align with the FDA approved guidance for the current laboratory assay.  If heparin results are below expected values, and patient dosage has  been confirmed, suggest follow up testing of  antithrombin III levels. Performed at [facility], [place] [address] [place], [place] [id number]   Basic metabolic panel     Status: Abnormal   Collection Time: 03/31/21  3:24 AM  Result Value Ref Range   Sodium 136 135 - 145 mmol/L   Potassium 3.7 3.5 - 5.1 mmol/L   Chloride 105 98 - 111 mmol/L   CO2 22 22 - 32 mmol/L   Glucose, Bld 101 (H) 70 - 99 mg/dL    Comment: Glucose reference range applies only to samples taken after fasting for at least 8 hours.   BUN 10 6 - 20 mg/dL   Creatinine, Ser 1.08 0.61 - 1.24 mg/dL   Calcium 9.3 8.9 - 10.3 mg/dL   GFR, Estimated >60 >60 mL/min    Comment: (NOTE) Calculated using the CKD-EPI Creatinine Equation (2021)    Anion gap 9 5 - 15    Comment: Performed at [facility] [address] [place], [place] [address]  Lipid panel     Status: Abnormal   Collection Time: 03/31/21  3:24 AM  Result Value Ref Range   Cholesterol 232 (H) 0 - 200 mg/dL   Triglycerides 81 <150 mg/dL   HDL 50 >40 mg/dL   Total CHOL/HDL Ratio 4.6 RATIO   VLDL 16 0 - 40 mg/dL   LDL Cholesterol 166 (H) 0 - 99 mg/dL    Comment:        Total Cholesterol/HDL:CHD Risk Coronary Heart Disease Risk Table                     Men   Women  1/2 Average Risk   3.4   3.3  Average Risk       5.0   4.4  2 X Average Risk   9.6   7.1  3 X Average Risk  23.4   11.0        Use the calculated Patient Ratio above and the CHD Risk Table to determine the patient's CHD Risk.        ATP III CLASSIFICATION (LDL):  <100      mg/dL   Optimal  100-129  mg/dL   Near or Above                    Optimal  130-159  mg/dL   Borderline  160-189  mg/dL   High  >190     mg/dL   Very High Performed at [facility] [address] [place], [place] [address]   CBC     Status: Abnormal   Collection Time: 03/31/21  3:24 AM  Result Value Ref Range   WBC 7.0 4.0 - 10.5 K/uL   RBC 4.25 4.22 - 5.81 MIL/uL   Hemoglobin 12.6 (L) 13.0 - 17.0 g/dL   HCT 39.4 39.0 - 52.0 %   MCV 92.7 80.0 - 100.0 fL   MCH 29.6 26.0 - 34.0 pg   MCHC 32.0 30.0 - 36.0 g/dL   RDW 13.7 11.5 - 15.5 %   Platelets 350 150 - 400 K/uL   nRBC 0.0 0.0 - 0.2 %    Comment: Performed at [facility], [place] [address] [place], [place] [address]    DG Chest Port 1 View  Result Date: 03/30/2021 CLINICAL DATA:  Cough and shortness of breath. EXAM: PORTABLE CHEST 1 VIEW COMPARISON:  None. FINDINGS: The heart size and mediastinal contours are within normal limits. Both lungs  are clear. The visualized skeletal structures are unremarkable. IMPRESSION: No active disease. Electronically Signed   By: Titus Dubin M.D.   On: 03/30/2021 10:43   ECHOCARDIOGRAM COMPLETE  Result Date: 03/30/2021    ECHOCARDIOGRAM REPORT   Patient Name:   Zachary Savage Date of Exam: 03/30/2021 Medical Rec #:  [id number]     Height:       69.0 in Accession #:    [id number]    Weight:       183.0 lb Date of Birth:  [birth year]      BSA:          1.989 m Patient Age:    25 years      BP:           125/95 mmHg Patient Gender: M             HR:           77 bpm. Exam Location:  Inpatient Procedure: 2D Echo, Cardiac Doppler and Color Doppler Indications:    Atrial fibrillation  History:        Patient has no prior history of Echocardiogram examinations.                 Previous Myocardial Infarction and CAD, Arrythmias:Atrial                 Fibrillation; Risk Factors:Hypertension and Current Smoker.  Sonographer:    Merrie Roof RDCS Referring Phys: [id number] [place]   1. Left ventricular ejection fraction, by estimation, is 55%. The left ventricle has normal function. Left ventricular endocardial border not optimally defined to evaluate regional wall motion. Left ventricular diastolic parameters are consistent with Grade II diastolic dysfunction (pseudonormalization).  2. Right ventricular systolic function is normal. The right ventricular size is normal. There is normal pulmonary artery systolic pressure. The estimated right ventricular systolic pressure is [id number] mmHg.  3. The mitral valve is normal in structure. No evidence of mitral valve regurgitation. No evidence of mitral stenosis.  4. The aortic valve is tricuspid. Aortic valve regurgitation is not visualized. Aortic valve sclerosis/calcification is present, without any evidence of aortic stenosis.  5. The inferior vena cava is normal in size with greater than 50% respiratory variability, suggesting right atrial pressure of 3 mmHg. FINDINGS  Left Ventricle: Left ventricular ejection fraction, by estimation, is 55%. The left ventricle has normal function. Left ventricular endocardial border not optimally defined to evaluate regional wall motion. The left ventricular internal cavity size was normal in size. There is no left ventricular hypertrophy. Left ventricular diastolic parameters are consistent with Grade II diastolic dysfunction (pseudonormalization). Right Ventricle: The right ventricular size is normal. No increase in right ventricular wall thickness. Right ventricular systolic function is normal. There is normal pulmonary artery systolic pressure. The tricuspid regurgitant velocity is 2.62 m/s, and  with an assumed right atrial pressure of 3 mmHg, the estimated right ventricular systolic pressure is [id number] mmHg. Left Atrium: Left atrial size was normal in size. Right Atrium: Right atrial size was normal in size. Pericardium: There is no evidence of pericardial effusion. Mitral Valve: The mitral valve is normal in  structure. No evidence of mitral valve regurgitation. No evidence of mitral valve stenosis. Tricuspid Valve: The tricuspid valve is normal in structure. Tricuspid valve regurgitation is trivial. Aortic Valve: The aortic valve is tricuspid. Aortic valve regurgitation is not visualized. Aortic valve sclerosis/calcification is present, without any evidence of aortic stenosis. Pulmonic Valve: The pulmonic valve was normal in  structure. Pulmonic valve regurgitation is not visualized. Aorta: The aortic root is normal in size and structure. Venous: The inferior vena cava is normal in size with greater than 50% respiratory variability, suggesting right atrial pressure of 3 mmHg. IAS/Shunts: No atrial level shunt detected by color flow Doppler.  LEFT VENTRICLE PLAX 2D LVIDd:         4.40 cm   Diastology LVIDs:         3.10 cm   LV e' medial:    6.64 cm/s LV PW:         1.10 cm   LV E/e' medial:  10.2 LV IVS:        1.00 cm   LV e' lateral:   10.80 cm/s LVOT diam:     2.00 cm   LV E/e' lateral: 6.3 LV SV:         57 LV SV Index:   29 LVOT Area:     3.14 cm  RIGHT VENTRICLE RV Basal diam:  2.60 cm LEFT ATRIUM             Index        RIGHT ATRIUM           Index LA diam:        2.70 cm 1.36 cm/m   RA Area:     16.40 cm LA Vol (A2C):   49.4 ml 24.83 ml/m  RA Volume:   41.70 ml  20.96 ml/m LA Vol (A4C):   34.9 ml 17.54 ml/m LA Biplane Vol: 43.6 ml 21.92 ml/m  AORTIC VALVE LVOT Vmax:   99.90 cm/s LVOT Vmean:  65.400 cm/s LVOT VTI:    0.181 m  AORTA Ao Root diam: 3.50 cm Ao Asc diam:  3.40 cm MITRAL VALVE               TRICUSPID VALVE MV Area (PHT): 3.39 cm    TR Peak grad:   27.5 mmHg MV Decel Time: 224 msec    TR Vmax:        262.00 cm/s MV E velocity: 67.70 cm/s MV A velocity: 64.70 cm/s  SHUNTS MV E/A ratio:  1.05        Systemic VTI:  0.18 m                            Systemic Diam: 2.00 cm Dalton McleanMD Electronically signed by Franki Monte Signature Date/Time: 03/30/2021/6:06:34 PM    Final     Review of  Systems  Constitutional:  Negative for diaphoresis, fatigue and fever.  HENT:  Negative for sore throat.   Eyes:  Negative for discharge.  Respiratory:  Positive for shortness of breath.   Cardiovascular:  Positive for palpitations. Negative for chest pain and leg swelling.  Gastrointestinal:  Negative for abdominal distention.  Genitourinary:  Negative for difficulty urinating.  Neurological:  Negative for dizziness and seizures.  Hematological:  Does not bruise/bleed easily.  Blood pressure (!) 153/111, pulse 73, temperature 97.8 F (36.6 C), temperature source Oral, resp. rate (!) 24, height 5\' 9"  (1.753 m), weight 83 kg, SpO2 95 %. Physical Exam Constitutional:      Appearance: He is well-developed.  HENT:     Head: Normocephalic and atraumatic.  Eyes:     Extraocular Movements: Extraocular movements intact.     Pupils: Pupils are equal, round, and reactive to light.  Neck:     Vascular: No JVD.  Cardiovascular:  Rate and Rhythm: Normal rate and regular rhythm.     Heart sounds: No murmur heard.   No friction rub.  Pulmonary:     Effort: Pulmonary effort is normal.     Breath sounds: Normal breath sounds.  Abdominal:     General: Bowel sounds are normal.     Palpations: Abdomen is soft.  Musculoskeletal:     Cervical back: Normal range of motion and neck supple.     Right lower leg: No tenderness. No edema.     Left lower leg: No tenderness. No edema.  Skin:    General: Skin is warm and dry.  Neurological:     General: No focal deficit present.     Mental Status: He is alert and oriented to person, place, and time.    Assessment/Plan: Status post A. fib with RVR precipitated by EtOH abuse CHA2DS2-VASc score of 2 Coronary artery disease history of inferior wall MI in the past status post PCI approximately 8 months ago Hypertension Hyperlipidemia Tobacco abuse EtOH abuse Plan DC aspirin and prasugril Start Brilinta 90 mg twice daily Start Eliquis 5 mg twice  daily and DC heparin Continue amiodarone and low-dose 200 mg daily for now Patient will follow-up with his cardiologist in [place]. Patient encouraged to stop smoking and alcohol completely.  Charolette Forward 03/31/2021, 9:41 AM

## 2021-03-31 NOTE — Progress Notes (Signed)
ANTICOAGULATION CONSULT NOTE  Pharmacy Consult for Heparin Indication: atrial fibrillation  No Known Allergies  Patient Measurements: Height: 5\' 9"  (175.3 cm) Weight: 83 kg (182 lb 15.7 oz) IBW/kg (Calculated) : 70.7  Heparin Dosing Weight: 82 kg  Vital Signs: Temp: 97.8 F (36.6 C) (11/17 0700) Temp Source: Oral (11/17 0700) BP: 153/111 (11/17 0700) Pulse Rate: 73 (11/17 0700)  Labs: Recent Labs    03/30/21 1022 03/30/21 1450 03/30/21 1744 03/31/21 0324  HGB 14.2  --   --  12.6*  HCT 43.2  --   --  39.4  PLT 391  --   --  350  HEPARINUNFRC  --   --  0.52 0.62  CREATININE 0.99  --   --  1.08  TROPONINIHS 22* 14  --   --     Estimated Creatinine Clearance: 73.6 mL/min (by C-G formula based on SCr of 1.08 mg/dL).   Medications:  Scheduled:   amiodarone  200 mg Oral Daily   aspirin EC  81 mg Oral Daily   atorvastatin  80 mg Oral QHS   folic acid  1 mg Oral Daily   losartan  25 mg Oral Daily   melatonin  3 mg Oral QHS   metoprolol succinate  100 mg Oral Daily   multivitamin with minerals  1 tablet Oral Daily   nicotine  21 mg Transdermal Daily   prasugrel  10 mg Oral Daily   sodium chloride flush  3 mL Intravenous Q12H   thiamine  100 mg Oral Daily   Or   thiamine  100 mg Intravenous Daily   Infusions:   sodium chloride     diltiazem (CARDIZEM) infusion Stopped (03/30/21 1851)   heparin 1,200 Units/hr (03/31/21 0434)    Assessment: 60 yo male presented on 03/30/2021 with SOB and cough. Pharmacy consulted to dose heparin for Afib. Patient is not on anticoagulation prior to admission.    Heparin level therapeutic yesterday evening and today. CBC stable, plts wnl. No s/sx bleeding noted. Will continue at this rate and check dailly heparin levels going forward.    Goal of Therapy:  Heparin level 0.3-0.7 units/ml Monitor platelets by anticoagulation protocol: Yes  Plan:  Continue heparin infusion at 1200 units/hr Check heparin level daily while on  heparin Continue to monitor H&H and platelets F/u plans for PO AC    Thank you for allowing pharmacy to be a part of this patient's care.  04/01/2021, PharmD Clinical Pharmacist

## 2021-04-15 ENCOUNTER — Other Ambulatory Visit (HOSPITAL_COMMUNITY): Payer: Self-pay

## 2021-04-15 ENCOUNTER — Telehealth (HOSPITAL_COMMUNITY): Payer: Self-pay

## 2021-04-15 NOTE — Telephone Encounter (Signed)
Pharmacy Transitions of Care Follow-up Telephone Call  Date of discharge: 03/31/21  Discharge Diagnosis: Afib w/ RVR  How have you been since you were released from the hospital?  Patient doing well since discharge, no questions about meds at this time.  Medication changes made at discharge:     START taking: amiodarone (PACERONE)  Brilinta (ticagrelor)  Eliquis (apixaban)  folic acid (FOLVITE)  multivitamin with minerals  nicotine (NICODERM CQ - dosed in mg/24 hours)  thiamine  STOP taking: aspirin 81 MG EC tablet  prasugrel 10 MG Tabs tablet (EFFIENT)   Medication changes verified by the patient? Yes    Medication Accessibility:  Home Pharmacy:  [facility]  Was the patient provided with refills on discharged medications? Yes   Have all prescriptions been transferred from [facility] to home pharmacy?  Yes  Is the patient able to afford medications? Has insurance    Medication Review:  APIXABAN (ELIQUIS)  Apixaban 5 mg BID initiated on 03/31/21.  - Discussed importance of taking medication around the same time everyday  - Advised patient of medications to avoid (NSAIDs, ASA)  - Educated that Tylenol (acetaminophen) will be the preferred analgesic to prevent risk of bleeding  - Emphasized importance of monitoring for signs and symptoms of bleeding (abnormal bruising, prolonged bleeding, nose bleeds, bleeding from gums, discolored urine, black tarry stools)  - Advised patient to alert all providers of anticoagulation therapy prior to starting a new medication or having a procedure   Patient also taking Brilinta  Follow-up Appointments:  PCP Hospital f/u appt confirmed? None scheduled  Specialist Hospital f/u appt confirmed? Sees cardiologist on 04/18/21  If their condition worsens, is the pt aware to call PCP or go to the Emergency Dept.? yes  Final Patient Assessment: Patient has refills at home pharmacy and has follow up scheduled

## 2022-03-17 ENCOUNTER — Other Ambulatory Visit: Payer: Self-pay

## 2022-12-04 IMAGING — DX DG CHEST 1V PORT
1 series · 1 of 1 positions shown · non-contrast
Comparison: None.

CLINICAL DATA: Cough and shortness of breath.

EXAM:
PORTABLE CHEST 1 VIEW

[chest ap]
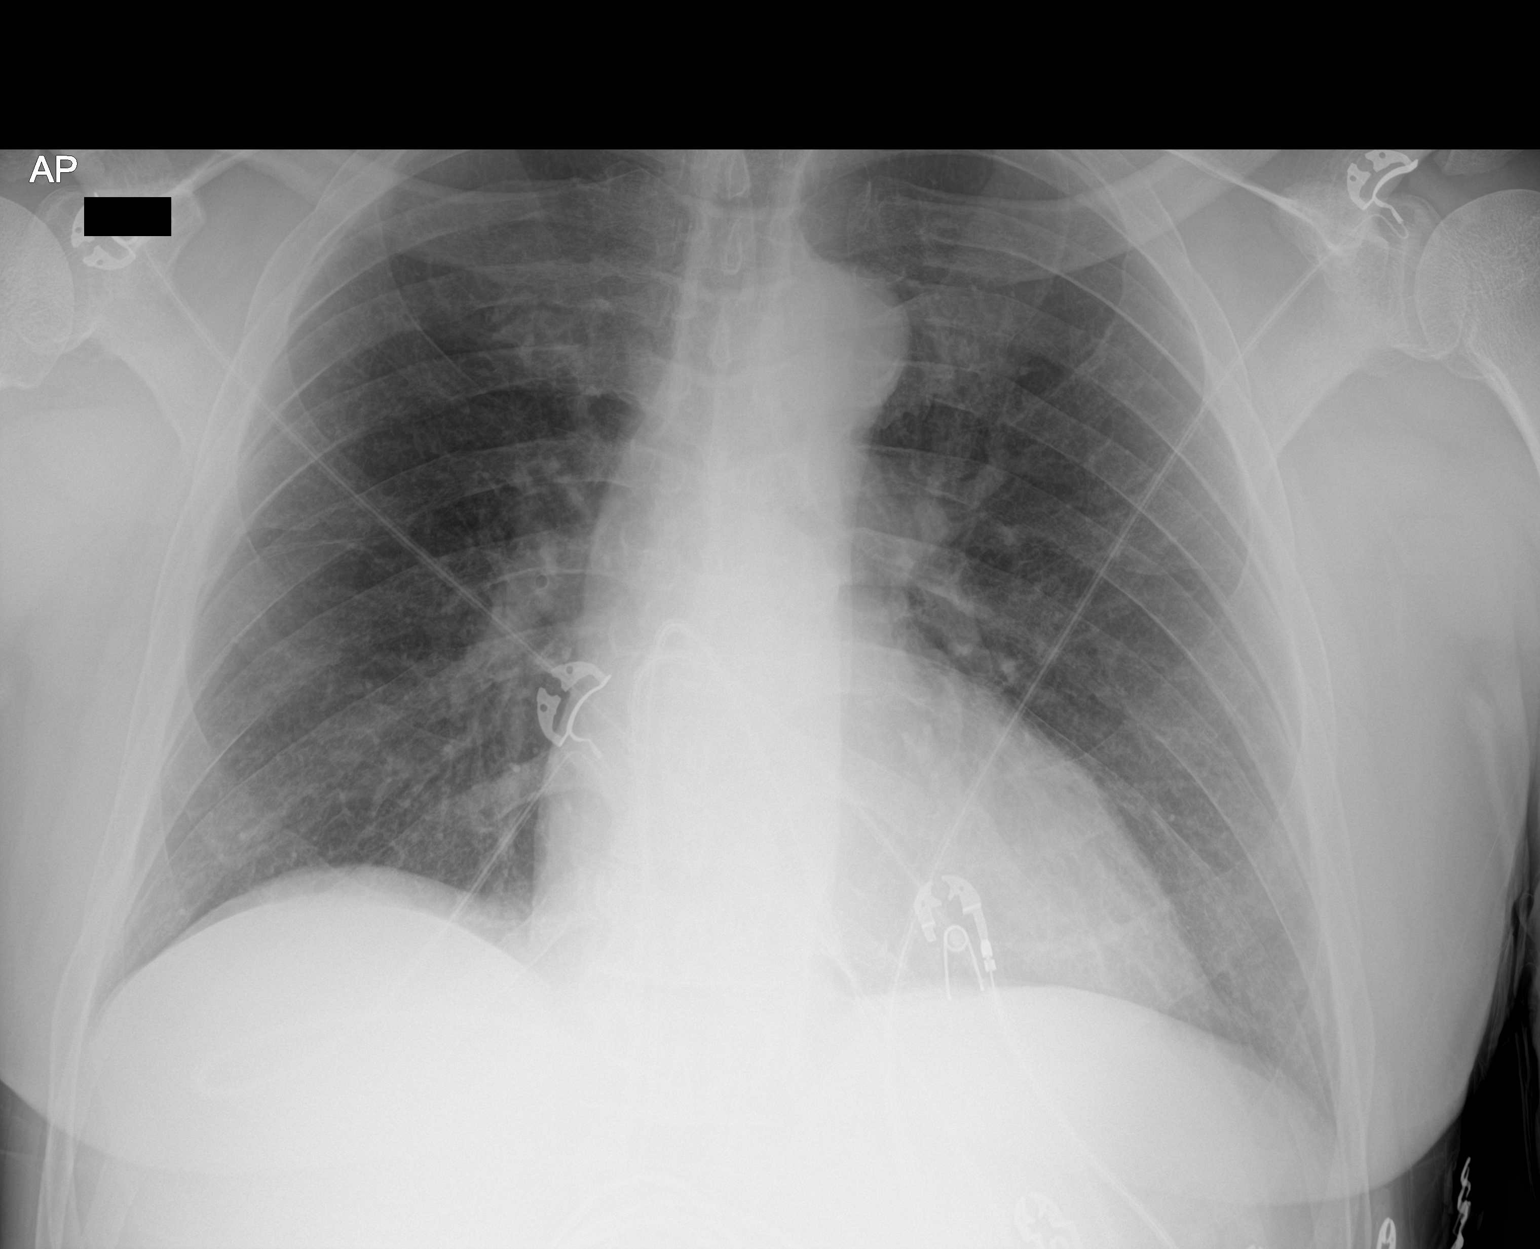

[1 of 1 positions shown; findings below may reference images not displayed]

FINDINGS: The heart size and mediastinal contours are within normal limits.
Both lungs are clear. The visualized skeletal structures are
unremarkable.
IMPRESSION: No active disease.
